# Patient Record
Sex: Female | Born: 1965
Health system: Southern US, Community
[De-identification: ages and names within clinical notes are randomized; demographics above are authoritative.]

## PROBLEM LIST (undated history)

## (undated) DIAGNOSIS — J45909 Unspecified asthma, uncomplicated: Secondary | ICD-10-CM

## (undated) HISTORY — PX: DILATION AND CURETTAGE OF UTERUS: SHX78

## (undated) HISTORY — PX: TUBAL LIGATION: SHX77

---

## 2016-01-22 DIAGNOSIS — N879 Dysplasia of cervix uteri, unspecified: Secondary | ICD-10-CM | POA: Diagnosis not present

## 2016-01-22 DIAGNOSIS — N72 Inflammatory disease of cervix uteri: Secondary | ICD-10-CM | POA: Diagnosis not present

## 2016-01-22 DIAGNOSIS — Z1231 Encounter for screening mammogram for malignant neoplasm of breast: Secondary | ICD-10-CM | POA: Diagnosis not present

## 2016-01-22 DIAGNOSIS — Z124 Encounter for screening for malignant neoplasm of cervix: Secondary | ICD-10-CM | POA: Diagnosis not present

## 2016-01-22 DIAGNOSIS — N841 Polyp of cervix uteri: Secondary | ICD-10-CM | POA: Diagnosis not present

## 2016-01-22 DIAGNOSIS — N938 Other specified abnormal uterine and vaginal bleeding: Secondary | ICD-10-CM | POA: Diagnosis not present

## 2016-04-22 DIAGNOSIS — N938 Other specified abnormal uterine and vaginal bleeding: Secondary | ICD-10-CM | POA: Diagnosis not present

## 2016-05-13 DIAGNOSIS — N938 Other specified abnormal uterine and vaginal bleeding: Secondary | ICD-10-CM | POA: Diagnosis not present

## 2016-05-19 DIAGNOSIS — R0602 Shortness of breath: Secondary | ICD-10-CM | POA: Diagnosis not present

## 2016-05-19 DIAGNOSIS — Z6823 Body mass index (BMI) 23.0-23.9, adult: Secondary | ICD-10-CM | POA: Diagnosis not present

## 2016-05-19 DIAGNOSIS — F419 Anxiety disorder, unspecified: Secondary | ICD-10-CM | POA: Diagnosis not present

## 2017-01-21 DIAGNOSIS — H9209 Otalgia, unspecified ear: Secondary | ICD-10-CM | POA: Diagnosis not present

## 2017-01-21 DIAGNOSIS — J069 Acute upper respiratory infection, unspecified: Secondary | ICD-10-CM | POA: Diagnosis not present

## 2017-01-21 DIAGNOSIS — R062 Wheezing: Secondary | ICD-10-CM | POA: Diagnosis not present

## 2017-07-08 DIAGNOSIS — H5203 Hypermetropia, bilateral: Secondary | ICD-10-CM | POA: Diagnosis not present

## 2017-07-08 DIAGNOSIS — H52223 Regular astigmatism, bilateral: Secondary | ICD-10-CM | POA: Diagnosis not present

## 2017-07-08 DIAGNOSIS — H524 Presbyopia: Secondary | ICD-10-CM | POA: Diagnosis not present

## 2017-09-01 DIAGNOSIS — M50123 Cervical disc disorder at C6-C7 level with radiculopathy: Secondary | ICD-10-CM | POA: Diagnosis not present

## 2017-09-01 DIAGNOSIS — M542 Cervicalgia: Secondary | ICD-10-CM | POA: Diagnosis not present

## 2017-09-01 DIAGNOSIS — M545 Low back pain: Secondary | ICD-10-CM | POA: Diagnosis not present

## 2017-09-01 DIAGNOSIS — M9901 Segmental and somatic dysfunction of cervical region: Secondary | ICD-10-CM | POA: Diagnosis not present

## 2017-09-06 DIAGNOSIS — M545 Low back pain: Secondary | ICD-10-CM | POA: Diagnosis not present

## 2017-09-06 DIAGNOSIS — M542 Cervicalgia: Secondary | ICD-10-CM | POA: Diagnosis not present

## 2017-09-06 DIAGNOSIS — M50123 Cervical disc disorder at C6-C7 level with radiculopathy: Secondary | ICD-10-CM | POA: Diagnosis not present

## 2017-09-06 DIAGNOSIS — M9901 Segmental and somatic dysfunction of cervical region: Secondary | ICD-10-CM | POA: Diagnosis not present

## 2017-09-08 DIAGNOSIS — M50123 Cervical disc disorder at C6-C7 level with radiculopathy: Secondary | ICD-10-CM | POA: Diagnosis not present

## 2017-09-08 DIAGNOSIS — M9901 Segmental and somatic dysfunction of cervical region: Secondary | ICD-10-CM | POA: Diagnosis not present

## 2017-09-08 DIAGNOSIS — M545 Low back pain: Secondary | ICD-10-CM | POA: Diagnosis not present

## 2017-09-08 DIAGNOSIS — M542 Cervicalgia: Secondary | ICD-10-CM | POA: Diagnosis not present

## 2017-11-30 DIAGNOSIS — Z01419 Encounter for gynecological examination (general) (routine) without abnormal findings: Secondary | ICD-10-CM | POA: Diagnosis not present

## 2017-11-30 DIAGNOSIS — N760 Acute vaginitis: Secondary | ICD-10-CM | POA: Diagnosis not present

## 2017-11-30 DIAGNOSIS — Z6824 Body mass index (BMI) 24.0-24.9, adult: Secondary | ICD-10-CM | POA: Diagnosis not present

## 2017-11-30 DIAGNOSIS — N926 Irregular menstruation, unspecified: Secondary | ICD-10-CM | POA: Diagnosis not present

## 2017-11-30 DIAGNOSIS — Z0000 Encounter for general adult medical examination without abnormal findings: Secondary | ICD-10-CM | POA: Diagnosis not present

## 2017-11-30 DIAGNOSIS — Z118 Encounter for screening for other infectious and parasitic diseases: Secondary | ICD-10-CM | POA: Diagnosis not present

## 2017-11-30 DIAGNOSIS — Z1151 Encounter for screening for human papillomavirus (HPV): Secondary | ICD-10-CM | POA: Diagnosis not present

## 2017-12-24 DIAGNOSIS — N924 Excessive bleeding in the premenopausal period: Secondary | ICD-10-CM | POA: Diagnosis not present

## 2017-12-24 DIAGNOSIS — Z1231 Encounter for screening mammogram for malignant neoplasm of breast: Secondary | ICD-10-CM | POA: Diagnosis not present

## 2017-12-29 ENCOUNTER — Other Ambulatory Visit: Payer: Self-pay | Admitting: Obstetrics and Gynecology

## 2017-12-29 DIAGNOSIS — N6489 Other specified disorders of breast: Secondary | ICD-10-CM

## 2017-12-30 ENCOUNTER — Ambulatory Visit
Admission: RE | Admit: 2017-12-30 | Discharge: 2017-12-30 | Disposition: A | Discharge status: Home / Self Care | Payer: BLUE CROSS/BLUE SHIELD | Source: Ambulatory Visit | Attending: Obstetrics and Gynecology | Admitting: Obstetrics and Gynecology

## 2017-12-30 ENCOUNTER — Other Ambulatory Visit: Payer: Self-pay | Admitting: Obstetrics and Gynecology

## 2017-12-30 DIAGNOSIS — N6489 Other specified disorders of breast: Secondary | ICD-10-CM

## 2017-12-30 DIAGNOSIS — N6311 Unspecified lump in the right breast, upper outer quadrant: Secondary | ICD-10-CM | POA: Diagnosis not present

## 2017-12-30 DIAGNOSIS — R922 Inconclusive mammogram: Secondary | ICD-10-CM | POA: Diagnosis not present

## 2017-12-30 DIAGNOSIS — N6313 Unspecified lump in the right breast, lower outer quadrant: Secondary | ICD-10-CM | POA: Diagnosis not present

## 2018-01-04 DIAGNOSIS — N939 Abnormal uterine and vaginal bleeding, unspecified: Secondary | ICD-10-CM | POA: Diagnosis not present

## 2018-02-02 ENCOUNTER — Encounter (HOSPITAL_COMMUNITY): Payer: Self-pay | Admitting: *Deleted

## 2018-02-02 ENCOUNTER — Other Ambulatory Visit: Payer: Self-pay

## 2018-02-07 ENCOUNTER — Other Ambulatory Visit: Payer: Self-pay | Admitting: Obstetrics and Gynecology

## 2018-02-18 ENCOUNTER — Ambulatory Visit (HOSPITAL_COMMUNITY): Payer: BLUE CROSS/BLUE SHIELD | Admitting: Anesthesiology

## 2018-02-18 ENCOUNTER — Encounter (HOSPITAL_COMMUNITY): Payer: Self-pay

## 2018-02-18 ENCOUNTER — Encounter (HOSPITAL_COMMUNITY)
Admission: RE | Disposition: A | Discharge status: Home / Self Care | Payer: Self-pay | Source: Ambulatory Visit | Attending: Obstetrics and Gynecology

## 2018-02-18 ENCOUNTER — Other Ambulatory Visit: Payer: Self-pay

## 2018-02-18 ENCOUNTER — Ambulatory Visit (HOSPITAL_COMMUNITY)
Admission: RE | Admit: 2018-02-18 | Discharge: 2018-02-18 | Disposition: A | Discharge status: Home / Self Care | Payer: BLUE CROSS/BLUE SHIELD | Source: Ambulatory Visit | Attending: Obstetrics and Gynecology | Admitting: Obstetrics and Gynecology

## 2018-02-18 DIAGNOSIS — J45909 Unspecified asthma, uncomplicated: Secondary | ICD-10-CM | POA: Insufficient documentation

## 2018-02-18 DIAGNOSIS — N840 Polyp of corpus uteri: Secondary | ICD-10-CM | POA: Insufficient documentation

## 2018-02-18 DIAGNOSIS — Z881 Allergy status to other antibiotic agents status: Secondary | ICD-10-CM | POA: Insufficient documentation

## 2018-02-18 DIAGNOSIS — Z79899 Other long term (current) drug therapy: Secondary | ICD-10-CM | POA: Diagnosis not present

## 2018-02-18 DIAGNOSIS — Z880 Allergy status to penicillin: Secondary | ICD-10-CM | POA: Insufficient documentation

## 2018-02-18 DIAGNOSIS — Z91018 Allergy to other foods: Secondary | ICD-10-CM | POA: Diagnosis not present

## 2018-02-18 DIAGNOSIS — N938 Other specified abnormal uterine and vaginal bleeding: Secondary | ICD-10-CM | POA: Diagnosis not present

## 2018-02-18 DIAGNOSIS — Z7951 Long term (current) use of inhaled steroids: Secondary | ICD-10-CM | POA: Insufficient documentation

## 2018-02-18 DIAGNOSIS — Z885 Allergy status to narcotic agent status: Secondary | ICD-10-CM | POA: Diagnosis not present

## 2018-02-18 HISTORY — DX: Unspecified asthma, uncomplicated: J45.909

## 2018-02-18 HISTORY — PX: DILATATION & CURETTAGE/HYSTEROSCOPY WITH MYOSURE: SHX6511

## 2018-02-18 LAB — BASIC METABOLIC PANEL
Anion gap: 5 (ref 5–15)
BUN: 15 mg/dL (ref 6–20)
CALCIUM: 8.7 mg/dL — AB (ref 8.9–10.3)
CO2: 25 mmol/L (ref 22–32)
Chloride: 105 mmol/L (ref 98–111)
Creatinine, Ser: 0.84 mg/dL (ref 0.44–1.00)
GFR calc Af Amer: >60 mL/min (ref >60)
GFR calc non Af Amer: >60 mL/min (ref >60)
Glucose, Bld: 89 mg/dL (ref 70–99)
Potassium: 4.3 mmol/L (ref 3.5–5.1)
Sodium: 135 mmol/L (ref 135–145)

## 2018-02-18 LAB — CBC
HCT: 40.1 % (ref 36.0–46.0)
Hemoglobin: 13.6 g/dL (ref 12.0–15.0)
MCH: 31.7 pg (ref 26.0–34.0)
MCHC: 33.9 g/dL (ref 30.0–36.0)
MCV: 93.5 fL (ref 80.0–100.0)
Platelets: 275 10*3/uL (ref 150–400)
RBC: 4.29 MIL/uL (ref 3.87–5.11)
RDW: 13.5 % (ref 11.5–15.5)
WBC: 5.7 10*3/uL (ref 4.0–10.5)
nRBC: 0 % (ref 0.0–0.2)

## 2018-02-18 LAB — HCG, SERUM, QUALITATIVE: Preg, Serum: NEGATIVE

## 2018-02-18 SURGERY — DILATATION & CURETTAGE/HYSTEROSCOPY WITH MYOSURE
Anesthesia: General

## 2018-02-18 MED ORDER — VASOPRESSIN 20 UNIT/ML IV SOLN
INTRAVENOUS | Status: DC | PRN
  Administered 2018-02-18: 20 [IU]

## 2018-02-18 MED ORDER — HYDROMORPHONE HCL 1 MG/ML IJ SOLN: 0.2500 mg | INTRAMUSCULAR | Status: DC | PRN

## 2018-02-18 MED ORDER — SCOPOLAMINE 1 MG/3DAYS TD PT72
1.0000 | MEDICATED_PATCH | Freq: Once | TRANSDERMAL | Status: DC
  Administered 2018-02-18: 1.5 mg via TRANSDERMAL

## 2018-02-18 MED ORDER — SCOPOLAMINE 1 MG/3DAYS TD PT72
MEDICATED_PATCH | TRANSDERMAL | Status: AC
  Administered 2018-02-18: 1.5 mg via TRANSDERMAL
  Filled 2018-02-18: qty 1

## 2018-02-18 MED ORDER — KETOROLAC TROMETHAMINE 30 MG/ML IJ SOLN
INTRAMUSCULAR | Status: DC | PRN
  Administered 2018-02-18: 30 mg via INTRAVENOUS

## 2018-02-18 MED ORDER — MEPERIDINE HCL 25 MG/ML IJ SOLN
6.2500 mg | INTRAMUSCULAR | Status: DC | PRN
  Administered 2018-02-18: 6.25 mg via INTRAVENOUS

## 2018-02-18 MED ORDER — ONDANSETRON HCL 4 MG/2ML IJ SOLN
INTRAMUSCULAR | Status: DC | PRN
  Administered 2018-02-18: 4 mg via INTRAVENOUS

## 2018-02-18 MED ORDER — PROPOFOL 10 MG/ML IV BOLUS
INTRAVENOUS | Status: DC | PRN
  Administered 2018-02-18: 50 mg via INTRAVENOUS
  Administered 2018-02-18: 150 mg via INTRAVENOUS

## 2018-02-18 MED ORDER — CEFAZOLIN SODIUM-DEXTROSE 2-4 GM/100ML-% IV SOLN
INTRAVENOUS | Status: AC
  Filled 2018-02-18: qty 100

## 2018-02-18 MED ORDER — BUPIVACAINE HCL (PF) 0.25 % IJ SOLN
INTRAMUSCULAR | Status: AC
  Filled 2018-02-18: qty 30

## 2018-02-18 MED ORDER — PHENYLEPHRINE HCL 10 MG/ML IJ SOLN
INTRAMUSCULAR | Status: DC | PRN
  Administered 2018-02-18: 80 ug via INTRAVENOUS

## 2018-02-18 MED ORDER — MEPERIDINE HCL 25 MG/ML IJ SOLN
INTRAMUSCULAR | Status: AC
  Filled 2018-02-18: qty 1

## 2018-02-18 MED ORDER — DEXAMETHASONE SODIUM PHOSPHATE 10 MG/ML IJ SOLN
INTRAMUSCULAR | Status: DC | PRN
  Administered 2018-02-18: 10 mg via INTRAVENOUS

## 2018-02-18 MED ORDER — LIDOCAINE HCL (CARDIAC) PF 100 MG/5ML IV SOSY
PREFILLED_SYRINGE | INTRAVENOUS | Status: DC | PRN
  Administered 2018-02-18: 80 mg via INTRAVENOUS

## 2018-02-18 MED ORDER — LIDOCAINE HCL (CARDIAC) PF 100 MG/5ML IV SOSY
PREFILLED_SYRINGE | INTRAVENOUS | Status: AC
  Filled 2018-02-18: qty 5

## 2018-02-18 MED ORDER — LACTATED RINGERS IV SOLN
INTRAVENOUS | Status: DC
  Administered 2018-02-18: 08:00:00 via INTRAVENOUS

## 2018-02-18 MED ORDER — OXYCODONE HCL 5 MG PO TABS: 5.0000 mg | ORAL_TABLET | Freq: Once | ORAL | Status: DC | PRN

## 2018-02-18 MED ORDER — SODIUM CHLORIDE (PF) 0.9 % IJ SOLN
INTRAMUSCULAR | Status: AC
  Filled 2018-02-18: qty 50

## 2018-02-18 MED ORDER — MIDAZOLAM HCL 2 MG/2ML IJ SOLN
INTRAMUSCULAR | Status: AC
  Filled 2018-02-18: qty 2

## 2018-02-18 MED ORDER — FENTANYL CITRATE (PF) 100 MCG/2ML IJ SOLN
INTRAMUSCULAR | Status: DC | PRN
  Administered 2018-02-18: 25 ug via INTRAVENOUS
  Administered 2018-02-18 (×2): 50 ug via INTRAVENOUS

## 2018-02-18 MED ORDER — BUPIVACAINE HCL (PF) 0.25 % IJ SOLN
INTRAMUSCULAR | Status: DC | PRN
  Administered 2018-02-18: 20 mL

## 2018-02-18 MED ORDER — KETOROLAC TROMETHAMINE 30 MG/ML IJ SOLN
INTRAMUSCULAR | Status: AC
  Filled 2018-02-18: qty 1

## 2018-02-18 MED ORDER — FENTANYL CITRATE (PF) 250 MCG/5ML IJ SOLN
INTRAMUSCULAR | Status: AC
  Filled 2018-02-18: qty 5

## 2018-02-18 MED ORDER — OXYCODONE HCL 5 MG/5ML PO SOLN: 5.0000 mg | Freq: Once | ORAL | Status: DC | PRN

## 2018-02-18 MED ORDER — MIDAZOLAM HCL 2 MG/2ML IJ SOLN
INTRAMUSCULAR | Status: DC | PRN
  Administered 2018-02-18: 1 mg via INTRAVENOUS

## 2018-02-18 MED ORDER — DEXAMETHASONE SODIUM PHOSPHATE 10 MG/ML IJ SOLN
INTRAMUSCULAR | Status: AC
  Filled 2018-02-18: qty 1

## 2018-02-18 MED ORDER — PHENYLEPHRINE 40 MCG/ML (10ML) SYRINGE FOR IV PUSH (FOR BLOOD PRESSURE SUPPORT)
PREFILLED_SYRINGE | INTRAVENOUS | Status: AC
  Filled 2018-02-18: qty 10

## 2018-02-18 MED ORDER — PROPOFOL 10 MG/ML IV BOLUS
INTRAVENOUS | Status: AC
  Filled 2018-02-18: qty 20

## 2018-02-18 MED ORDER — ONDANSETRON HCL 4 MG/2ML IJ SOLN
INTRAMUSCULAR | Status: AC
  Filled 2018-02-18: qty 2

## 2018-02-18 MED ORDER — TRAMADOL HCL 50 MG PO TABS: 50.0000 mg | ORAL_TABLET | Freq: Four times a day (QID) | ORAL | 0 refills | Status: AC | PRN

## 2018-02-18 MED ORDER — CEFAZOLIN SODIUM-DEXTROSE 2-4 GM/100ML-% IV SOLN
2.0000 g | INTRAVENOUS | Status: AC
  Administered 2018-02-18: 2 g via INTRAVENOUS

## 2018-02-18 MED ORDER — OXYCODONE HCL 5 MG PO TABS
ORAL_TABLET | ORAL | Status: AC
  Filled 2018-02-18: qty 1

## 2018-02-18 MED ORDER — VASOPRESSIN 20 UNIT/ML IV SOLN
INTRAVENOUS | Status: AC
  Filled 2018-02-18: qty 1

## 2018-02-18 MED ORDER — PROMETHAZINE HCL 25 MG/ML IJ SOLN: 6.2500 mg | INTRAMUSCULAR | Status: DC | PRN

## 2018-02-18 SURGICAL SUPPLY — 17 items
CATH ROBINSON RED A/P 16FR (CATHETERS) ×2 IMPLANT
DECANTER SPIKE VIAL GLASS SM (MISCELLANEOUS) ×4 IMPLANT
DEVICE MYOSURE LITE (MISCELLANEOUS) ×2 IMPLANT
DEVICE MYOSURE REACH (MISCELLANEOUS) IMPLANT
GLOVE BIO SURGEON STRL SZ7.5 (GLOVE) ×2 IMPLANT
GLOVE BIOGEL PI IND STRL 7.0 (GLOVE) ×1 IMPLANT
GLOVE BIOGEL PI INDICATOR 7.0 (GLOVE) ×1
GOWN STRL REUS W/TWL LRG LVL3 (GOWN DISPOSABLE) ×4 IMPLANT
HIBICLENS CHG 4% 4OZ BTL (MISCELLANEOUS) ×2 IMPLANT
KIT PROCEDURE FLUENT (KITS) ×2 IMPLANT
NEEDLE SPNL 22GX3.5 QUINCKE BK (NEEDLE) ×4 IMPLANT
PACK VAGINAL MINOR WOMEN LF (CUSTOM PROCEDURE TRAY) ×2 IMPLANT
PAD OB MATERNITY 4.3X12.25 (PERSONAL CARE ITEMS) ×2 IMPLANT
SEAL ROD LENS SCOPE MYOSURE (ABLATOR) ×2 IMPLANT
SYR CONTROL 10ML LL (SYRINGE) ×4 IMPLANT
SYR TB 1ML 25GX5/8 (SYRINGE) ×2 IMPLANT
TOWEL OR 17X24 6PK STRL BLUE (TOWEL DISPOSABLE) ×4 IMPLANT

## 2018-02-18 NOTE — Discharge Instructions (Signed)

## 2018-02-18 NOTE — Op Note (Signed)
02/18/2018  8:48 AM  PATIENT:  Jodi Hanna  53 y.o. female  PRE-OPERATIVE DIAGNOSIS:  Abnormal Uterine Bleeding  POST-OPERATIVE DIAGNOSIS:  Abnormal Uterine Bleeding Endometrial polyps  PROCEDURE:  Procedure(s): DILATATION & CURETTAGE HYSTEROSCOPY WITH MYOSURE RESECTION OF ENDOMETRIAL POLYPS  SURGEON:  Surgeon(s): Olivia Mackie, MD  ASSISTANTS: none   ANESTHESIA:   local and general  ESTIMATED BLOOD LOSS: 25 mL   DRAINS: none   LOCAL MEDICATIONS USED:  MARCAINE    and Amount: 20 ml  SPECIMEN:  Source of Specimen:  EMC AND POLYPS  DISPOSITION OF SPECIMEN:  PATHOLOGY  COUNTS:  YES  DICTATION #: [id number]  PLAN OF CARE: DC HOME  PATIENT DISPOSITION:  PACU - hemodynamically stable.

## 2018-02-18 NOTE — Op Note (Signed)
NAME: Jodi Hanna, Jodi C. MEDICAL RECORD [id number] ACCOUNT [id number] DATE OF BIRTH:10-06-1965 FACILITY: [facility] LOCATION: [facility] PH[facility]ICIAN:Timira Bieda J. Billy CoastAAVON, MD  OPERATIVE REPORT  DATE OF PROCEDURE:  02/18/2018  PREOPERATIVE DIAGNOSIS:  Perimenopausal dysfunctional uterine bleeding.  POSTOPERATIVE DIAGNOSIS:  Perimenopausal dysfunctional uterine bleeding, multiple endometrial polyps.  PROCEDURE:  Diagnostic hysteroscopy, dilatation and curettage, MyoSure resection of multiple endometrial polyps.  SURGEON:  Olivia Mackieichard Eunie Lawn, MD  ASSISTANT:  None.  ANESTHESIA:  General.  ESTIMATED BLOOD LOSS:  Less than 50 mL.  FLUID DEFICIT:  100 mL  COMPLICATIONS:  None.  DRAINS:  None.  COUNTS:  Correct.  DISPOSITION:  The patient was taken to the recovery in good condition.  SPECIMENS:  EMC and polyp to pathology.  BRIEF OPERATIVE NOTE:  After being apprised of the risks of anesthesia, infection, bleeding, and surrounding organs, possible need for repair, delayed versus immediate complications including bowel and bladder, internal vessel injury, possible need for  repair, the patient was brought to the operating room where she was administered general anesthetic without complications.  Prepped and draped in usual sterile fashion.  Catheterized until her bladder was empty.  Exam under anesthesia revealing a  retroflexed uterus.  No adnexal masses.  Dilute Pitressin solution placed at 3 and 9 o'clock 18 mL total.  Dilute paracervical block 20 mL Marcaine without complications.  Cervix easily dilated up to a #21 Pratt dilator.  Hysteroscope placed.   Visualization revealed 2 left lateral wall/fundal endometrial polyps which were resected using the MyoSure device without difficulty in their entirety.  Bilateral normal tubal ostia, normal cavity.  No evidence of perforation.  D and C performed using  sharp curettage in a 4-quadrant method.  Specimen sent to pathology.  At this time, procedure  terminated.  Deficit as noted.  The patient tolerated the procedure well, was awakened and transferred to recovery in good condition.  TN/NUANCE  D:02/18/2018 T:02/18/2018 JOB:[id number]

## 2018-02-18 NOTE — Anesthesia Procedure Notes (Signed)
Procedure Name: LMA Insertion Date/Time: 02/18/2018 8:23 AM Performed by: Algis Greenhouse, CRNA Pre-anesthesia Checklist: Patient being monitored, Patient identified, Emergency Drugs available and Suction available Patient Re-evaluated:Patient Re-evaluated prior to induction Oxygen Delivery Method: Circle system utilized Preoxygenation: Pre-oxygenation with 100% oxygen Induction Type: IV induction and Inhalational induction Ventilation: Mask ventilation without difficulty LMA: LMA inserted LMA Size: 4.0 Number of attempts: 1 Dental Injury: Teeth and Oropharynx as per pre-operative assessment

## 2018-02-18 NOTE — H&P (Signed)
Jodi LauthKimberly C Hanna is an 53 y.o. female. Perimenopausal DUB with structural lesion for surgical evaluation  Pertinent Gynecological History: Menses: post-menopausal Bleeding: intermenstrual bleeding Contraception: none DES exposure: denies Blood transfusions: none Sexually transmitted diseases: no past history Previous GYN Procedures: na  Last mammogram: normal Date: 2019 Last pap: normal Date: 2019 OB History: G2, P2   Menstrual History: Menarche age: 89 Patient's last menstrual period was 02/10/2018.    Past Medical History:  Diagnosis Date  . Asthma    seasonal - rarely uses inhaler  . SVD (spontaneous vaginal delivery)    x 2    Past Surgical History:  Procedure Laterality Date  . CESAREAN SECTION     patient gave incorrect information - no c/s surgery  . DILATION AND CURETTAGE OF UTERUS    . TUBAL LIGATION      History reviewed. No pertinent family history.  Social History:  reports that she has never smoked. She has never used smokeless tobacco. She reports that she does not drink alcohol or use drugs.  Allergies:  Allergies  Allergen Reactions  . Hydrocodone Nausea And Vomiting  . Penicillins Hives    DID THE REACTION INVOLVE: Swelling of the face/tongue/throat, SOB, or low BP? No Sudden or severe rash/hives, skin peeling, or the inside of the mouth or nose? Yes Did it require medical treatment? No When did it last happen?childhood allergy If all above answers are "NO", may proceed with cephalosporin use.   Marland Kitchen. Spinach     Upset stomach and headaches  . Tetracyclines & Related Hives    Brain swelling    Medications Prior to Admission  Medication Sig Dispense Refill Last Dose  . Biotin (BIOTIN 5000) 5 MG CAPS Take 5,000 mg by mouth daily.   Past Month at Unknown time  . budesonide-formoterol (SYMBICORT) 160-4.5 MCG/ACT inhaler Inhale 1 puff into the lungs daily as needed (shortness of breath).   Past Month at Unknown time  . Cholecalciferol (VITAMIN  D3) 125 MCG (5000 UT) CAPS Take 5,000 Units by mouth every other day.   Past Month at Unknown time  . COLLAGEN PO Take 1 Dose by mouth daily.   Past Month at Unknown time  . acetaminophen (TYLENOL) 500 MG tablet Take 500 mg by mouth daily as needed for moderate pain or headache.   More than a month at Unknown time    Review of Systems  Constitutional: Negative.   All other systems reviewed and are negative.   Blood pressure 108/65, pulse 68, temperature 98.1 F (36.7 C), temperature source Oral, resp. rate 18, height 5\' 3"  (1.6 m), weight 61.7 kg, last menstrual period 02/10/2018, SpO2 100 %. Physical Exam  Nursing note and vitals reviewed. Constitutional: She is oriented to person, place, and time. She appears well-developed and well-nourished.  Neck: Normal range of motion. Neck supple.  Cardiovascular: Normal rate and regular rhythm.  Respiratory: Effort normal and breath sounds normal.  GI: Soft. Bowel sounds are normal.  Genitourinary:    Vagina and uterus normal.   Musculoskeletal: Normal range of motion.  Neurological: She is alert and oriented to person, place, and time. She has normal reflexes.  Skin: Skin is warm and dry.  Psychiatric: She has a normal mood and affect.    Results for orders placed or performed during the hospital encounter of 02/18/18 (from the past 24 hour(s))  Basic metabolic panel     Status: Abnormal   Collection Time: 02/18/18  7:10 AM  Result Value Ref Range  Sodium 135 135 - 145 mmol/L   Potassium 4.3 3.5 - 5.1 mmol/L   Chloride 105 98 - 111 mmol/L   CO2 25 22 - 32 mmol/L   Glucose, Bld 89 70 - 99 mg/dL   BUN 15 6 - 20 mg/dL   Creatinine, Ser 5.400.84 0.44 - 1.00 mg/dL   Calcium 8.7 (L) 8.9 - 10.3 mg/dL   GFR calc non Af Amer >60 >60 mL/min   GFR calc Af Amer >60 >60 mL/min   Anion gap 5 5 - 15  CBC     Status: None   Collection Time: 02/18/18  7:10 AM  Result Value Ref Range   WBC 5.7 4.0 - 10.5 K/uL   RBC 4.29 3.87 - 5.11 MIL/uL    Hemoglobin 13.6 12.0 - 15.0 g/dL   HCT 98.140.1 19.136.0 - 47.846.0 %   MCV 93.5 80.0 - 100.0 fL   MCH 31.7 26.0 - 34.0 pg   MCHC 33.9 30.0 - 36.0 g/dL   RDW 29.513.5 62.111.5 - 30.815.5 %   Platelets 275 150 - 400 K/uL   nRBC 0.0 0.0 - 0.2 %  hCG, serum, qualitative     Status: None   Collection Time: 02/18/18  7:10 AM  Result Value Ref Range   Preg, Serum NEGATIVE NEGATIVE    No results found.  Assessment/Plan: Perimenopausal AUB with structural lesion Diag HS, D&C, Myosure Consent signed Surgical risks discussed. Risks of perforation and injury to surrounding organs with need for repair discussed. Inability to diagnose or prevent cancer discussed. Need for transfusion and risks of blood products noted.  Waverly Chavarria J 02/18/2018, 7:49 AM

## 2018-02-18 NOTE — Transfer of Care (Signed)
Immediate Anesthesia Transfer of Care Note  Patient: KALEIYAH CAULEY  Procedure(s) Performed: DILATATION & CURETTAGE/HYSTEROSCOPY WITH MYOSURE (N/A )  Patient Location: PACU  Anesthesia Type:General  Level of Consciousness: sedated  Airway & Oxygen Therapy: Patient Spontanous Breathing and Patient connected to nasal cannula oxygen  Post-op Assessment: Report given to RN  Post vital signs: Reviewed and stable  Last Vitals:  Vitals Value Taken Time  BP    Temp    Pulse 64 02/18/2018  9:17 AM  Resp    SpO2 100 % 02/18/2018  9:17 AM  Vitals shown include unvalidated device data.  Last Pain:  Vitals:   02/18/18 0733  TempSrc: Oral      Patients Stated Pain Goal: 4 (02/18/18 0733)  Complications: No apparent anesthesia complications

## 2018-02-18 NOTE — Anesthesia Preprocedure Evaluation (Signed)
Anesthesia Evaluation  Patient identified by MRN, date of birth, ID band Patient awake    Reviewed: Allergy & Precautions, NPO status , Patient's Chart, lab work & pertinent test results  Airway Mallampati: II  TM Distance: >3 FB Neck ROM: Full    Dental no notable dental hx.    Pulmonary asthma ,    Pulmonary exam normal breath sounds clear to auscultation       Cardiovascular negative cardio ROS Normal cardiovascular exam Rhythm:Regular Rate:Normal     Neuro/Psych negative neurological ROS  negative psych ROS   GI/Hepatic negative GI ROS, Neg liver ROS,   Endo/Other  negative endocrine ROS  Renal/GU negative Renal ROS  negative genitourinary   Musculoskeletal negative musculoskeletal ROS (+)   Abdominal   Peds negative pediatric ROS (+)  Hematology negative hematology ROS (+)   Anesthesia Other Findings   Reproductive/Obstetrics negative OB ROS                             Anesthesia Physical Anesthesia Plan  ASA: II  Anesthesia Plan: General   Post-op Pain Management:    Induction: Intravenous  PONV Risk Score and Plan: 3 and Ondansetron, Dexamethasone and Midazolam  Airway Management Planned: LMA  Additional Equipment:   Intra-op Plan:   Post-operative Plan: Extubation in OR  Informed Consent: I have reviewed the patients History and Physical, chart, labs and discussed the procedure including the risks, benefits and alternatives for the proposed anesthesia with the patient or authorized representative who has indicated his/her understanding and acceptance.   Dental advisory given  Plan Discussed with: CRNA  Anesthesia Plan Comments:         Anesthesia Quick Evaluation

## 2018-02-18 NOTE — Anesthesia Postprocedure Evaluation (Signed)
Anesthesia Post Note  Patient: Jodi Hanna  Procedure(s) Performed: DILATATION & CURETTAGE/HYSTEROSCOPY WITH MYOSURE (N/A )     Patient location during evaluation: PACU Anesthesia Type: General Level of consciousness: awake and alert Pain management: pain level controlled Vital Signs Assessment: post-procedure vital signs reviewed and stable Respiratory status: spontaneous breathing, nonlabored ventilation and respiratory function stable Cardiovascular status: blood pressure returned to baseline and stable Postop Assessment: no apparent nausea or vomiting Anesthetic complications: no    Last Vitals:  Vitals:   02/18/18 1030 02/18/18 1039  BP: 109/74   Pulse: (!) 54 (!) 54  Resp: (!) 7 14  Temp:    SpO2: 98% 98%    Last Pain:  Vitals:   02/18/18 0733  TempSrc: Oral   Pain Goal: Patients Stated Pain Goal: 4 (02/18/18 0733)               [name]

## 2018-02-18 NOTE — Progress Notes (Signed)
Patient seen and examined. Consent witnessed and signed. No changes noted. Update completed. BP 108/65   Pulse 68   Temp 98.1 F (36.7 C) (Oral)   Resp 18   Ht 5\' 3"  (1.6 m)   Wt 61.7 kg   LMP 02/10/2018   SpO2 100%   BMI 24.09 kg/m   CBC    Component Value Date/Time   WBC 5.7 02/18/2018 0710   RBC 4.29 02/18/2018 0710   HGB 13.6 02/18/2018 0710   HCT 40.1 02/18/2018 0710   PLT 275 02/18/2018 0710   MCV 93.5 02/18/2018 0710   MCH 31.7 02/18/2018 0710   MCHC 33.9 02/18/2018 0710   RDW 13.5 02/18/2018 0710

## 2018-02-19 ENCOUNTER — Encounter (HOSPITAL_COMMUNITY): Payer: Self-pay | Admitting: Obstetrics and Gynecology

## 2018-03-08 DIAGNOSIS — R300 Dysuria: Secondary | ICD-10-CM | POA: Diagnosis not present

## 2018-03-18 DIAGNOSIS — Z09 Encounter for follow-up examination after completed treatment for conditions other than malignant neoplasm: Secondary | ICD-10-CM | POA: Diagnosis not present

## 2018-03-18 DIAGNOSIS — N950 Postmenopausal bleeding: Secondary | ICD-10-CM | POA: Diagnosis not present

## 2018-03-18 DIAGNOSIS — R42 Dizziness and giddiness: Secondary | ICD-10-CM | POA: Diagnosis not present

## 2018-03-18 DIAGNOSIS — N926 Irregular menstruation, unspecified: Secondary | ICD-10-CM | POA: Diagnosis not present

## 2018-03-30 DIAGNOSIS — M542 Cervicalgia: Secondary | ICD-10-CM | POA: Diagnosis not present

## 2018-03-30 DIAGNOSIS — M545 Low back pain: Secondary | ICD-10-CM | POA: Diagnosis not present

## 2018-03-30 DIAGNOSIS — M50123 Cervical disc disorder at C6-C7 level with radiculopathy: Secondary | ICD-10-CM | POA: Diagnosis not present

## 2018-03-30 DIAGNOSIS — M9901 Segmental and somatic dysfunction of cervical region: Secondary | ICD-10-CM | POA: Diagnosis not present

## 2018-06-07 DIAGNOSIS — N760 Acute vaginitis: Secondary | ICD-10-CM | POA: Diagnosis not present

## 2018-06-22 DIAGNOSIS — M542 Cervicalgia: Secondary | ICD-10-CM | POA: Diagnosis not present

## 2018-06-22 DIAGNOSIS — M9901 Segmental and somatic dysfunction of cervical region: Secondary | ICD-10-CM | POA: Diagnosis not present

## 2018-06-22 DIAGNOSIS — M50123 Cervical disc disorder at C6-C7 level with radiculopathy: Secondary | ICD-10-CM | POA: Diagnosis not present

## 2018-06-22 DIAGNOSIS — M545 Low back pain: Secondary | ICD-10-CM | POA: Diagnosis not present

## 2018-07-01 ENCOUNTER — Inpatient Hospital Stay: Admission: RE | Admit: 2018-07-01 | Payer: BLUE CROSS/BLUE SHIELD | Source: Ambulatory Visit

## 2018-09-26 ENCOUNTER — Other Ambulatory Visit: Payer: Self-pay | Admitting: Obstetrics and Gynecology

## 2018-09-26 ENCOUNTER — Other Ambulatory Visit: Payer: Self-pay

## 2018-09-26 ENCOUNTER — Ambulatory Visit
Admission: RE | Admit: 2018-09-26 | Discharge: 2018-09-26 | Disposition: A | Discharge status: Home / Self Care | Payer: BC Managed Care – PPO | Source: Ambulatory Visit | Attending: Obstetrics and Gynecology | Admitting: Obstetrics and Gynecology

## 2018-09-26 DIAGNOSIS — N6310 Unspecified lump in the right breast, unspecified quadrant: Secondary | ICD-10-CM | POA: Diagnosis not present

## 2018-09-26 DIAGNOSIS — N6489 Other specified disorders of breast: Secondary | ICD-10-CM

## 2018-09-28 ENCOUNTER — Other Ambulatory Visit: Payer: Self-pay | Admitting: Obstetrics and Gynecology

## 2018-12-30 ENCOUNTER — Ambulatory Visit
Admission: RE | Admit: 2018-12-30 | Discharge: 2018-12-30 | Disposition: A | Discharge status: Home / Self Care | Payer: BC Managed Care – PPO | Source: Ambulatory Visit | Attending: Obstetrics and Gynecology | Admitting: Obstetrics and Gynecology

## 2018-12-30 ENCOUNTER — Other Ambulatory Visit: Payer: Self-pay

## 2018-12-30 DIAGNOSIS — N6311 Unspecified lump in the right breast, upper outer quadrant: Secondary | ICD-10-CM | POA: Diagnosis not present

## 2018-12-30 DIAGNOSIS — R922 Inconclusive mammogram: Secondary | ICD-10-CM | POA: Diagnosis not present

## 2018-12-30 DIAGNOSIS — N6489 Other specified disorders of breast: Secondary | ICD-10-CM

## 2018-12-30 DIAGNOSIS — N6313 Unspecified lump in the right breast, lower outer quadrant: Secondary | ICD-10-CM | POA: Diagnosis not present

## 2019-01-16 DIAGNOSIS — Z6825 Body mass index (BMI) 25.0-25.9, adult: Secondary | ICD-10-CM | POA: Diagnosis not present

## 2019-01-16 DIAGNOSIS — Z01419 Encounter for gynecological examination (general) (routine) without abnormal findings: Secondary | ICD-10-CM | POA: Diagnosis not present

## 2019-01-24 DIAGNOSIS — J4540 Moderate persistent asthma, uncomplicated: Secondary | ICD-10-CM | POA: Diagnosis not present

## 2019-01-24 DIAGNOSIS — J309 Allergic rhinitis, unspecified: Secondary | ICD-10-CM | POA: Diagnosis not present

## 2019-02-01 DIAGNOSIS — M546 Pain in thoracic spine: Secondary | ICD-10-CM | POA: Diagnosis not present

## 2019-02-03 DIAGNOSIS — M50123 Cervical disc disorder at C6-C7 level with radiculopathy: Secondary | ICD-10-CM | POA: Diagnosis not present

## 2019-02-03 DIAGNOSIS — M542 Cervicalgia: Secondary | ICD-10-CM | POA: Diagnosis not present

## 2019-02-03 DIAGNOSIS — M545 Low back pain: Secondary | ICD-10-CM | POA: Diagnosis not present

## 2019-02-03 DIAGNOSIS — M9901 Segmental and somatic dysfunction of cervical region: Secondary | ICD-10-CM | POA: Diagnosis not present

## 2019-03-18 IMAGING — MG DIGITAL DIAGNOSTIC UNILATERAL RIGHT MAMMOGRAM WITH TOMO AND CAD
4 series · 4 of 12 positions shown · non-contrast
Comparison: Baseline screening mammogram 12/24/2017

CLINICAL DATA: Screening recall for a possible right breast mass.

EXAM:
DIGITAL DIAGNOSTIC RIGHT MAMMOGRAM WITH CAD AND TOMO
ULTRASOUND RIGHT BREAST

[R MLO synth-2D]
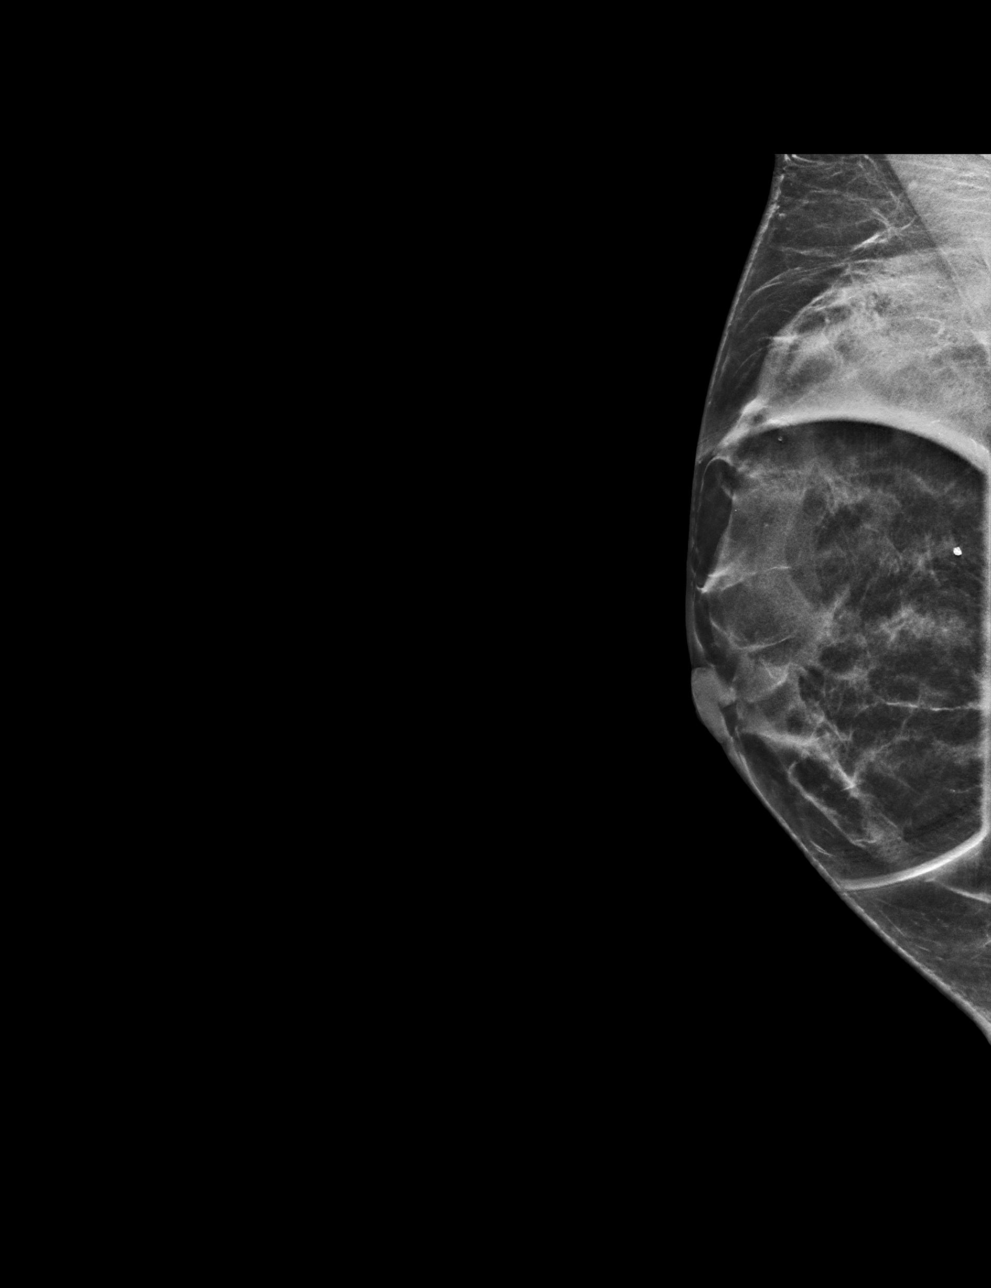

[R CC synth-2D]
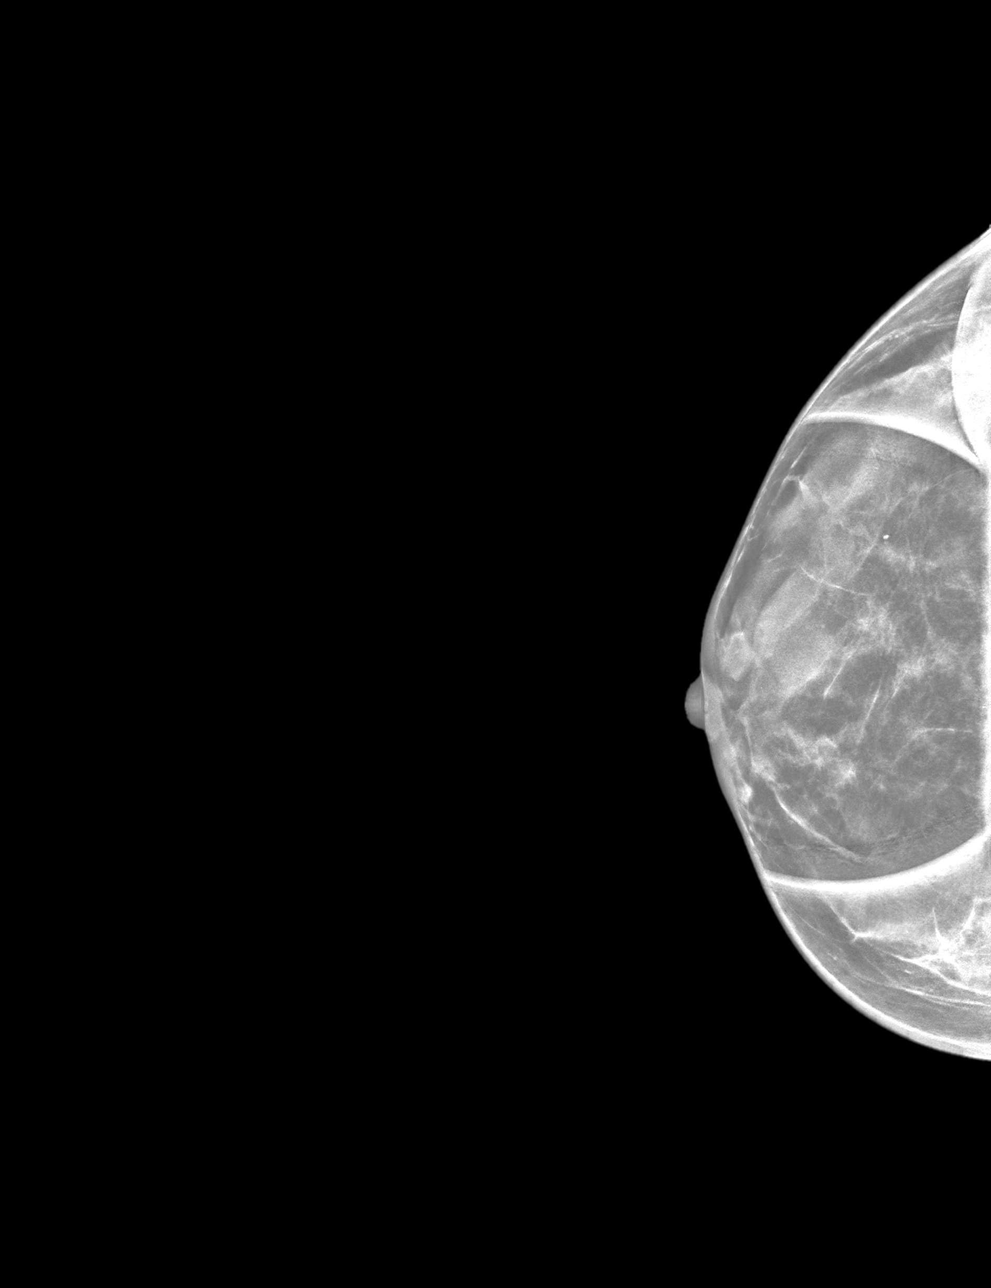

[R MLO tomo · tomo slice 21/40.0]
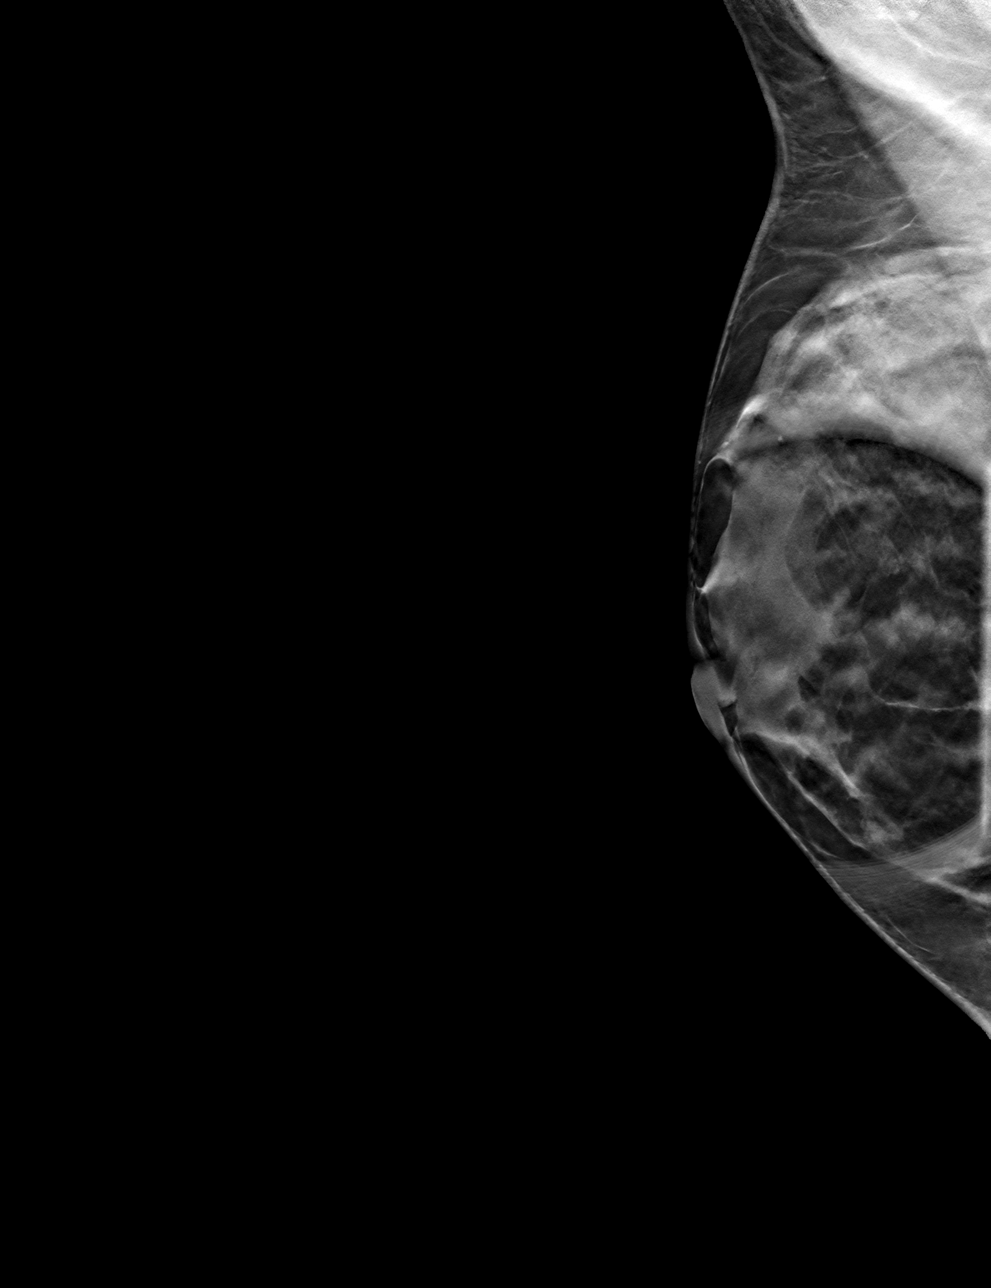

[R CC tomo · tomo slice 21/40.0]
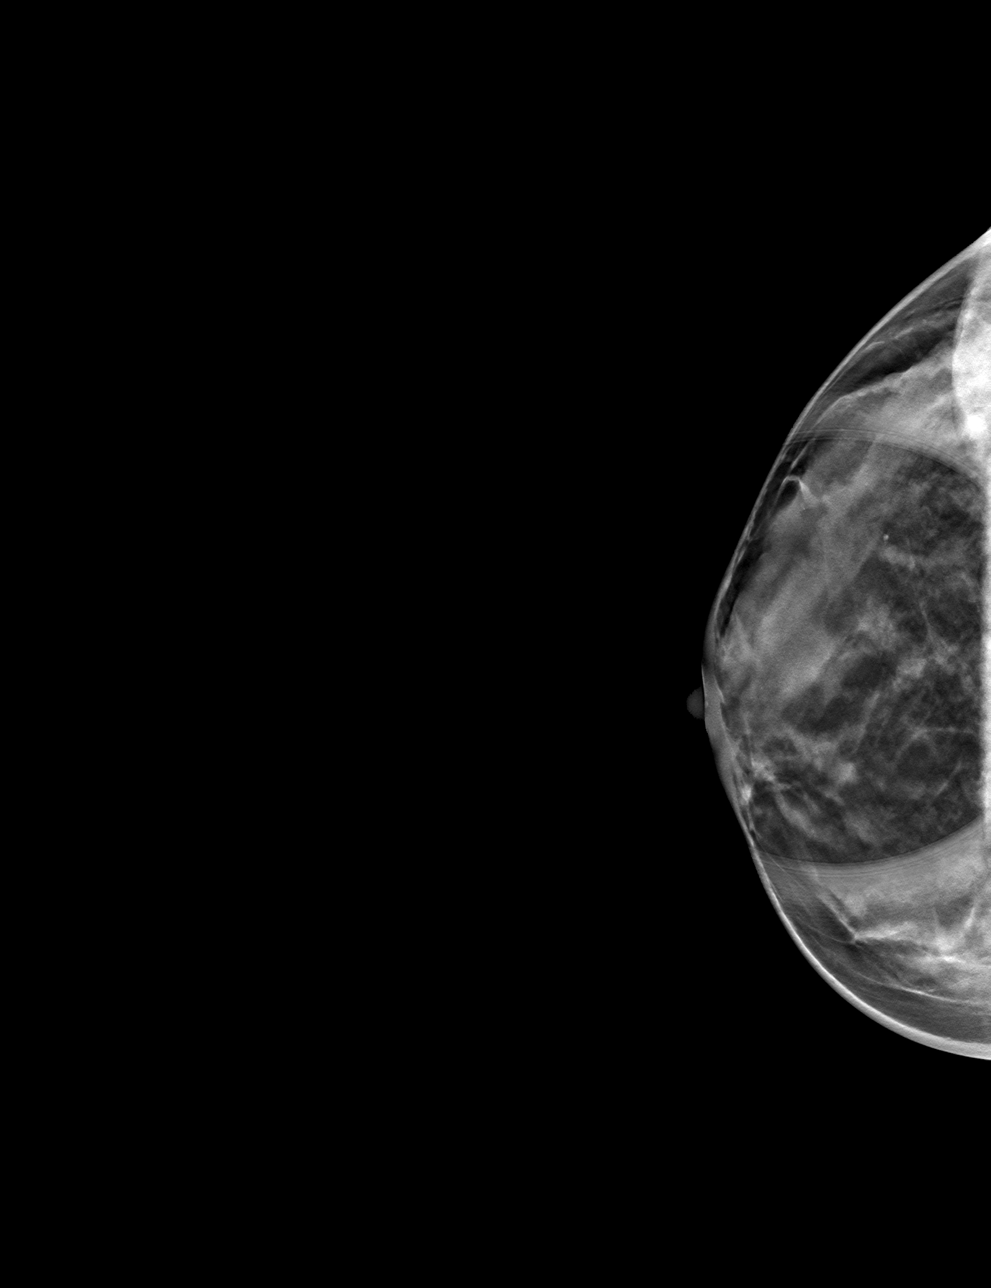

[4 of 12 positions shown; findings below may reference images not displayed]

ACR Breast Density Category c: The breast tissue is heterogeneously
dense, which may obscure small masses.
FINDINGS: On the spot compression tomosynthesis CC image of the right breast
there is a possible oval mass measuring 8 mm.

Mammographic images were processed with CAD.

Ultrasound of the retroareolar right breast at 9 o'clock
demonstrates a circumscribed oval hypoechoic mass measuring 8 x 3 x
6 mm.
IMPRESSION: There is a probably benign mass in the retroareolar right breast at
9 o'clock. This is favored to represent a fibroadenoma.

RECOMMENDATION:
Six-month follow-up right breast ultrasound is recommended.

I have discussed the findings and recommendations with the patient.
Results were also provided in writing at the conclusion of the
visit. If applicable, a reminder letter will be sent to the patient
regarding the next appointment.

BI-RADS CATEGORY  3: Probably benign.

## 2019-03-18 IMAGING — US ULTRASOUND RIGHT BREAST LIMITED
1 series · 8 of 8 positions shown · non-contrast
Comparison: Baseline screening mammogram 12/24/2017

CLINICAL DATA: Screening recall for a possible right breast mass.

EXAM:
DIGITAL DIAGNOSTIC RIGHT MAMMOGRAM WITH CAD AND TOMO
ULTRASOUND RIGHT BREAST

[Series 1: ultrasound right breast limited · 0.06mm/px · 8 of 8 slices shown]
[im 1/8]
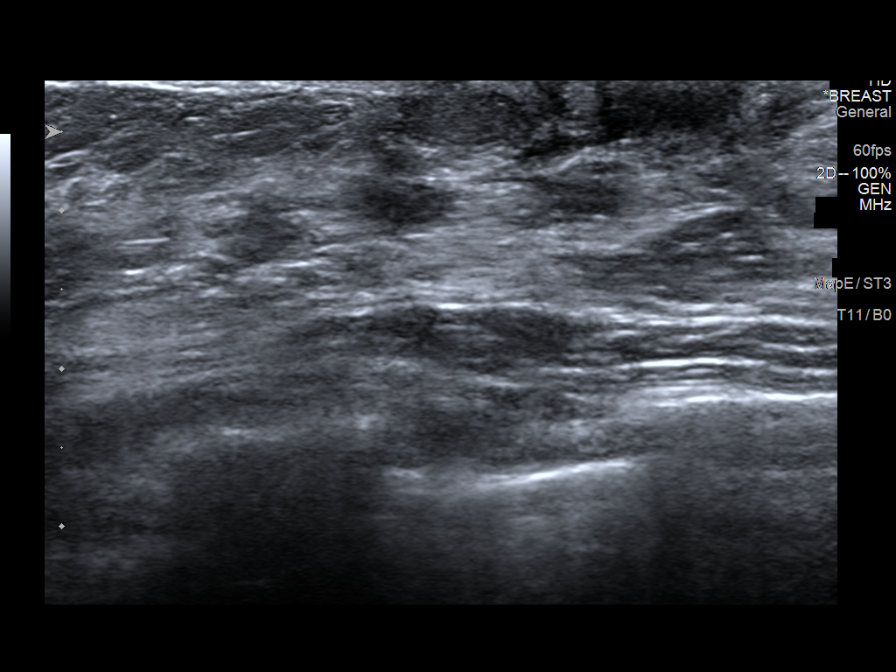
[im 2/8]
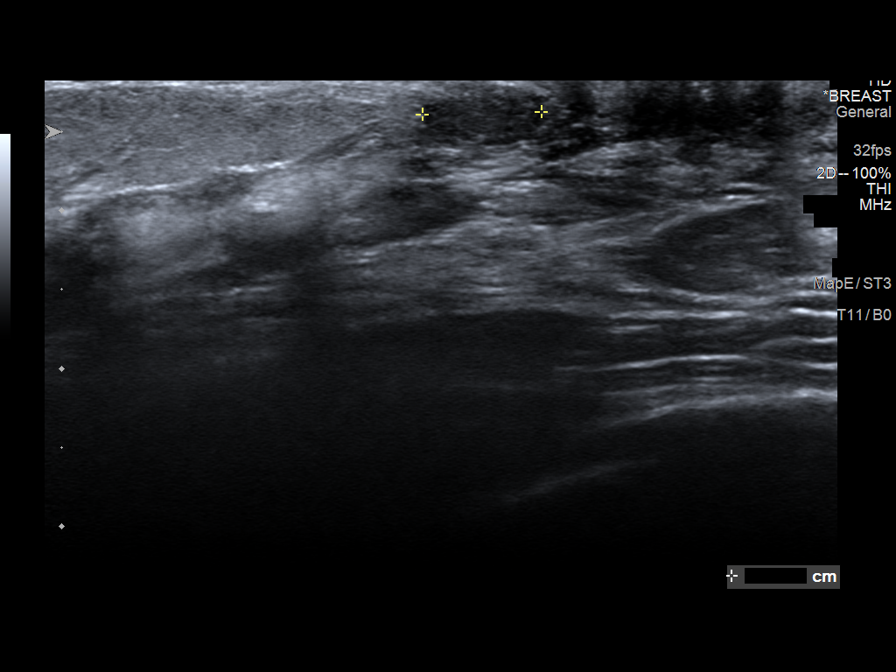
[im 3/8]
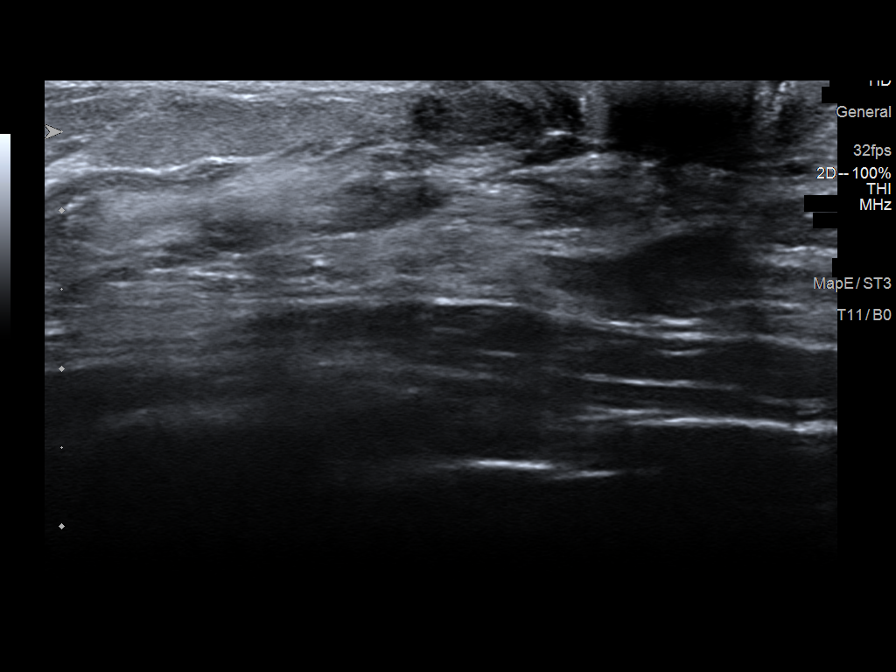
[im 4/8]
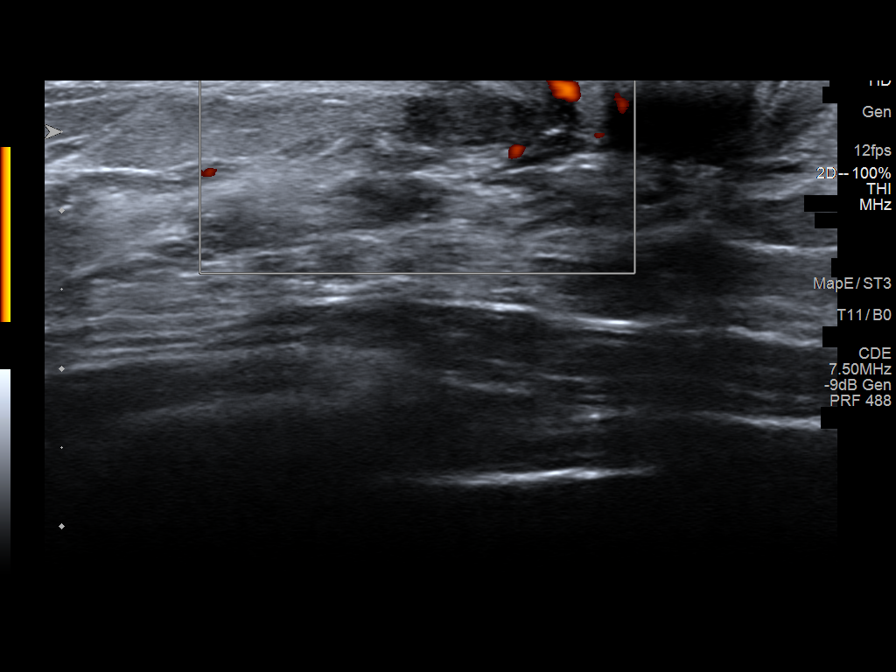
[im 5/8]
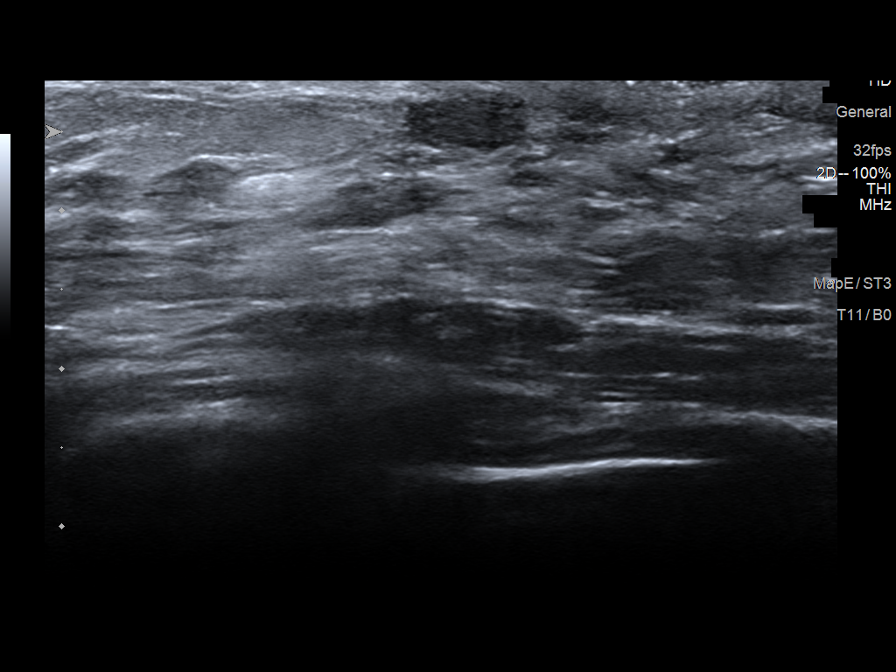
[im 6/8]
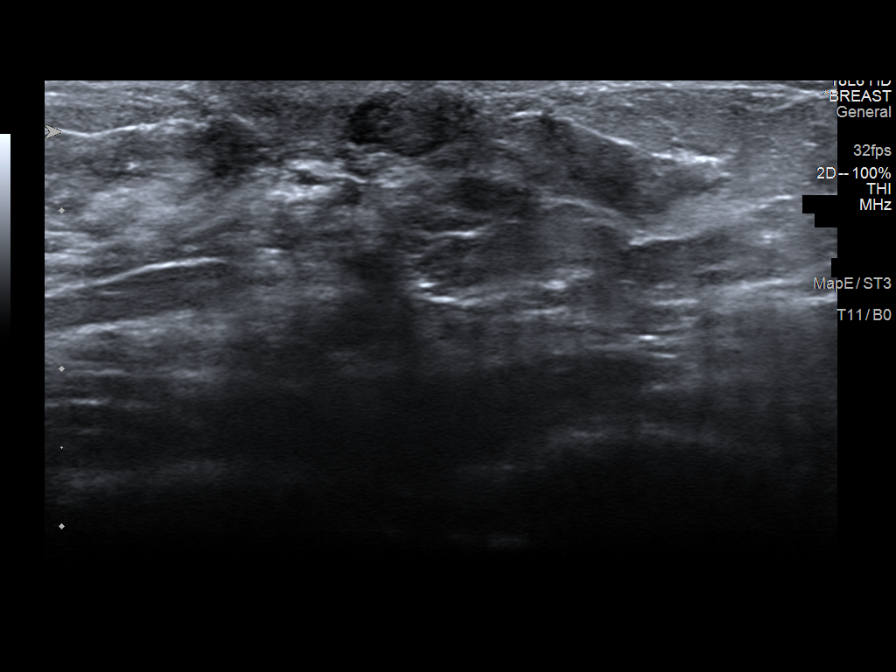
[im 7/8]
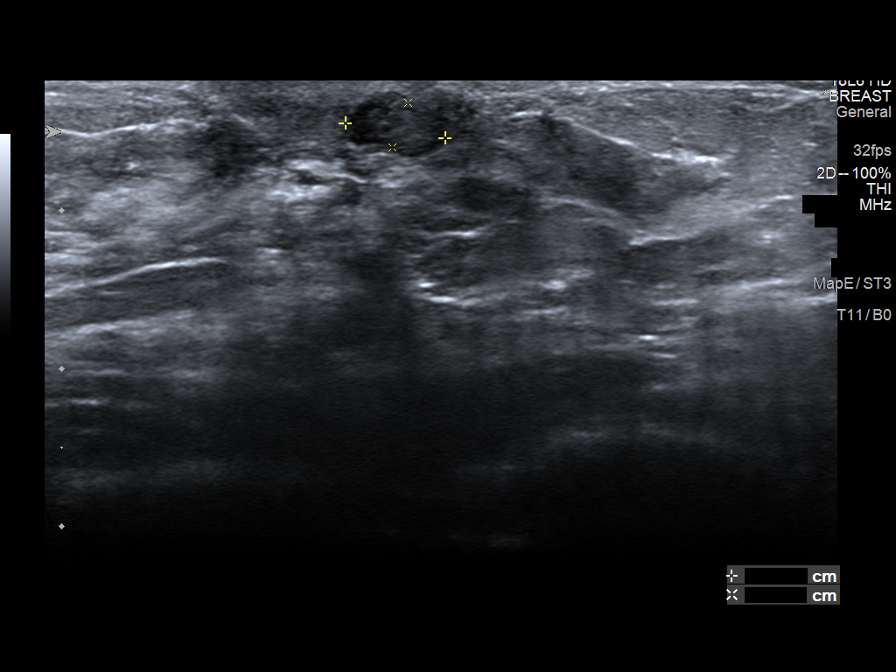
[im 8/8]
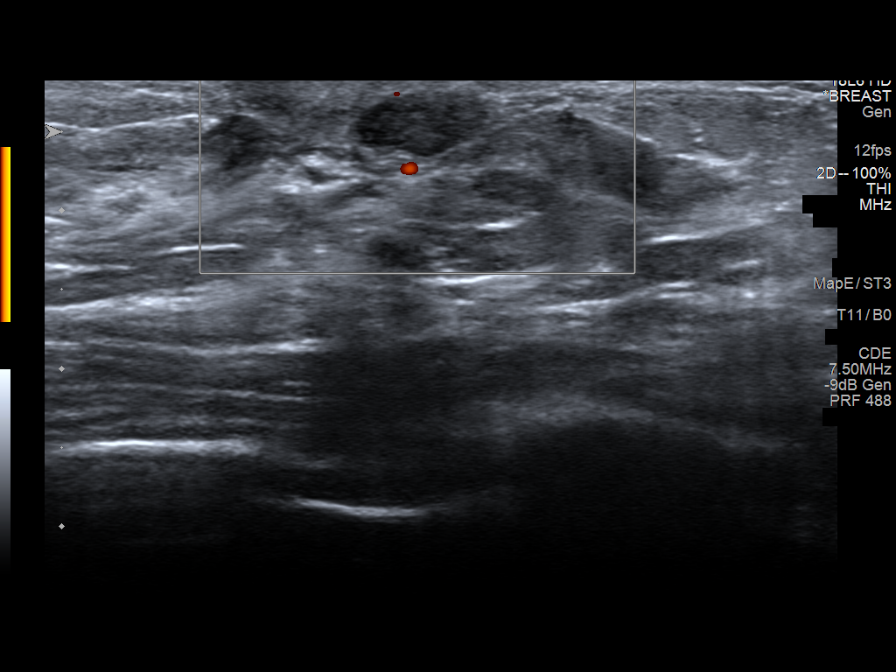

[8 of 8 positions shown; findings below may reference images not displayed]

ACR Breast Density Category c: The breast tissue is heterogeneously
dense, which may obscure small masses.
FINDINGS: On the spot compression tomosynthesis CC image of the right breast
there is a possible oval mass measuring 8 mm.

Mammographic images were processed with CAD.

Ultrasound of the retroareolar right breast at 9 o'clock
demonstrates a circumscribed oval hypoechoic mass measuring 8 x 3 x
6 mm.
IMPRESSION: There is a probably benign mass in the retroareolar right breast at
9 o'clock. This is favored to represent a fibroadenoma.

RECOMMENDATION:
Six-month follow-up right breast ultrasound is recommended.

I have discussed the findings and recommendations with the patient.
Results were also provided in writing at the conclusion of the
visit. If applicable, a reminder letter will be sent to the patient
regarding the next appointment.

BI-RADS CATEGORY  3: Probably benign.

## 2019-05-26 DIAGNOSIS — M9901 Segmental and somatic dysfunction of cervical region: Secondary | ICD-10-CM | POA: Diagnosis not present

## 2019-05-26 DIAGNOSIS — M545 Low back pain: Secondary | ICD-10-CM | POA: Diagnosis not present

## 2019-05-26 DIAGNOSIS — M542 Cervicalgia: Secondary | ICD-10-CM | POA: Diagnosis not present

## 2019-05-26 DIAGNOSIS — M50123 Cervical disc disorder at C6-C7 level with radiculopathy: Secondary | ICD-10-CM | POA: Diagnosis not present

## 2019-06-06 DIAGNOSIS — H5203 Hypermetropia, bilateral: Secondary | ICD-10-CM | POA: Diagnosis not present

## 2019-09-11 DIAGNOSIS — H6501 Acute serous otitis media, right ear: Secondary | ICD-10-CM | POA: Diagnosis not present

## 2019-09-11 DIAGNOSIS — J309 Allergic rhinitis, unspecified: Secondary | ICD-10-CM | POA: Diagnosis not present

## 2019-09-11 DIAGNOSIS — J4541 Moderate persistent asthma with (acute) exacerbation: Secondary | ICD-10-CM | POA: Diagnosis not present

## 2019-09-13 DIAGNOSIS — D485 Neoplasm of uncertain behavior of skin: Secondary | ICD-10-CM | POA: Diagnosis not present

## 2019-09-13 DIAGNOSIS — L814 Other melanin hyperpigmentation: Secondary | ICD-10-CM | POA: Diagnosis not present

## 2019-09-13 DIAGNOSIS — L821 Other seborrheic keratosis: Secondary | ICD-10-CM | POA: Diagnosis not present

## 2019-09-13 DIAGNOSIS — L819 Disorder of pigmentation, unspecified: Secondary | ICD-10-CM | POA: Diagnosis not present

## 2019-09-13 DIAGNOSIS — D225 Melanocytic nevi of trunk: Secondary | ICD-10-CM | POA: Diagnosis not present

## 2019-09-26 DIAGNOSIS — S90852A Superficial foreign body, left foot, initial encounter: Secondary | ICD-10-CM | POA: Diagnosis not present

## 2019-10-05 DIAGNOSIS — R197 Diarrhea, unspecified: Secondary | ICD-10-CM | POA: Diagnosis not present

## 2019-10-05 DIAGNOSIS — B349 Viral infection, unspecified: Secondary | ICD-10-CM | POA: Diagnosis not present

## 2019-10-05 DIAGNOSIS — Z20822 Contact with and (suspected) exposure to covid-19: Secondary | ICD-10-CM | POA: Diagnosis not present

## 2019-10-05 DIAGNOSIS — J45909 Unspecified asthma, uncomplicated: Secondary | ICD-10-CM | POA: Diagnosis not present

## 2020-02-15 ENCOUNTER — Other Ambulatory Visit: Payer: Self-pay | Admitting: Obstetrics and Gynecology

## 2020-02-15 DIAGNOSIS — Z1231 Encounter for screening mammogram for malignant neoplasm of breast: Secondary | ICD-10-CM

## 2020-02-21 ENCOUNTER — Other Ambulatory Visit: Payer: Self-pay | Admitting: Obstetrics and Gynecology

## 2020-02-21 DIAGNOSIS — N63 Unspecified lump in unspecified breast: Secondary | ICD-10-CM

## 2020-03-27 ENCOUNTER — Ambulatory Visit
Admission: RE | Admit: 2020-03-27 | Discharge: 2020-03-27 | Disposition: A | Discharge status: Home / Self Care | Payer: BC Managed Care – PPO | Source: Ambulatory Visit | Attending: Obstetrics and Gynecology | Admitting: Obstetrics and Gynecology

## 2020-03-27 ENCOUNTER — Other Ambulatory Visit: Payer: Self-pay

## 2020-03-27 ENCOUNTER — Other Ambulatory Visit: Payer: Self-pay | Admitting: Obstetrics and Gynecology

## 2020-03-27 DIAGNOSIS — N63 Unspecified lump in unspecified breast: Secondary | ICD-10-CM

## 2021-03-05 ENCOUNTER — Other Ambulatory Visit: Payer: Self-pay | Admitting: Obstetrics and Gynecology

## 2021-03-05 DIAGNOSIS — Z1231 Encounter for screening mammogram for malignant neoplasm of breast: Secondary | ICD-10-CM

## 2021-03-28 ENCOUNTER — Ambulatory Visit
Admission: RE | Admit: 2021-03-28 | Discharge: 2021-03-28 | Disposition: A | Discharge status: Home / Self Care | Payer: BC Managed Care – PPO | Source: Ambulatory Visit | Attending: Obstetrics and Gynecology | Admitting: Obstetrics and Gynecology

## 2021-03-28 DIAGNOSIS — Z1231 Encounter for screening mammogram for malignant neoplasm of breast: Secondary | ICD-10-CM

## 2022-02-25 ENCOUNTER — Other Ambulatory Visit: Payer: Self-pay | Admitting: Obstetrics and Gynecology

## 2022-02-25 DIAGNOSIS — Z1231 Encounter for screening mammogram for malignant neoplasm of breast: Secondary | ICD-10-CM

## 2022-04-17 ENCOUNTER — Ambulatory Visit
Admission: RE | Admit: 2022-04-17 | Discharge: 2022-04-17 | Disposition: A | Discharge status: Home / Self Care | Payer: BC Managed Care – PPO | Source: Ambulatory Visit | Attending: Obstetrics and Gynecology | Admitting: Obstetrics and Gynecology

## 2022-04-17 DIAGNOSIS — Z1231 Encounter for screening mammogram for malignant neoplasm of breast: Secondary | ICD-10-CM

## 2022-04-22 ENCOUNTER — Other Ambulatory Visit: Payer: Self-pay | Admitting: Obstetrics and Gynecology

## 2022-04-22 DIAGNOSIS — R928 Other abnormal and inconclusive findings on diagnostic imaging of breast: Secondary | ICD-10-CM

## 2022-05-06 ENCOUNTER — Ambulatory Visit
Admission: RE | Admit: 2022-05-06 | Discharge: 2022-05-06 | Disposition: A | Discharge status: Home / Self Care | Payer: BC Managed Care – PPO | Source: Ambulatory Visit | Attending: Obstetrics and Gynecology | Admitting: Obstetrics and Gynecology

## 2022-05-06 DIAGNOSIS — R928 Other abnormal and inconclusive findings on diagnostic imaging of breast: Secondary | ICD-10-CM

## 2023-05-20 ENCOUNTER — Other Ambulatory Visit: Payer: Self-pay | Admitting: Obstetrics and Gynecology

## 2023-05-20 DIAGNOSIS — Z1231 Encounter for screening mammogram for malignant neoplasm of breast: Secondary | ICD-10-CM

## 2023-05-28 ENCOUNTER — Ambulatory Visit
Admission: RE | Admit: 2023-05-28 | Discharge: 2023-05-28 | Disposition: A | Discharge status: Home / Self Care | Source: Ambulatory Visit | Attending: Obstetrics and Gynecology | Admitting: Obstetrics and Gynecology

## 2023-05-28 DIAGNOSIS — Z1231 Encounter for screening mammogram for malignant neoplasm of breast: Secondary | ICD-10-CM

## 2023-08-23 ENCOUNTER — Ambulatory Visit (INDEPENDENT_AMBULATORY_CARE_PROVIDER_SITE_OTHER): Admitting: Podiatry

## 2023-08-23 DIAGNOSIS — L02611 Cutaneous abscess of right foot: Secondary | ICD-10-CM | POA: Diagnosis not present

## 2023-08-23 DIAGNOSIS — L03031 Cellulitis of right toe: Secondary | ICD-10-CM | POA: Diagnosis not present

## 2023-08-23 DIAGNOSIS — L600 Ingrowing nail: Secondary | ICD-10-CM | POA: Diagnosis not present

## 2023-08-23 MED ORDER — CEPHALEXIN 500 MG PO CAPS: ORAL_CAPSULE | ORAL | 0 refills | Status: AC

## 2023-08-23 NOTE — Progress Notes (Signed)
 Patient complains of painful ingrown lateral border(s) toe hallux right. Patient denies fevers, chills, nausea, vomiting.  Objective:  Vitals: Reviewed  General: Well developed, nourished, in no acute distress, alert and oriented x3   Vascular: DP pulse 2/4 bilateral. PT pulse 2/4 bilateral.  Mild to moderate edema hallux right  Dermatology: Erythema, edema, incurvated nail border lateral hallux with clear drainage . Tenderness present with palpation. Normal skin tone and texture feet with normal hair growth.  Redness present along nail fold  Neurological: Grossly intact. Normal reflexes.   Musculoskeletal: Tenderness with palpation of the distal hallux right. No tenderness or painful ROM at IPJ.  Diagnosis: 1.  Cellulitis great toe right 2.  Ingrown nail lateral border hallux right  Plan: -discussed etiology and treatment of ingrown nails. Discussed surgical vs conservative treatment. -Consent signed for appropriate matrixectomy affected nail(s). -Rx: Keflex  500 mg, 2 p.o. twice daily for 7 days  Procedure(s):   - Matrixectomy(s) lateral border hallux right: Toe anesthetized with 3cc 2:1 mixture 2% Lidocaine  with epinephrine: Sodium Bicarbonate. Surgical site prepped. Digital tourniquet applied.  Avulsion of nail border. performed. Matrixecomy performed with three 30 second applications of phenol to nail matrix. Site irrigated with alcohol.  Tourniquet released with good vascularity noticed in digit.  Applied triple antibiotic to nailbed and applied gauze and Coban dressing. - Written and oral postoperative instructions given.  -Return for post-op 2 weeks.  JINNY Prentice Binder, DPM

## 2023-08-23 NOTE — Patient Instructions (Signed)

## 2023-08-23 NOTE — Addendum Note (Signed)
 Addended by: GIB MABLE SAUNDERS on: 08/23/2023 06:45 PM   Modules accepted: Orders
